# Patient Record
Sex: Male | Born: 1995 | Hispanic: Yes | Marital: Single | Smoking: Never smoker
Health system: Southern US, Community
[De-identification: ages and names within clinical notes are randomized; demographics above are authoritative.]

---

## 2018-12-22 DIAGNOSIS — L0390 Cellulitis, unspecified: Secondary | ICD-10-CM | POA: Diagnosis not present

## 2019-04-10 ENCOUNTER — Encounter (HOSPITAL_COMMUNITY): Payer: Self-pay | Admitting: Emergency Medicine

## 2019-04-10 ENCOUNTER — Emergency Department (HOSPITAL_COMMUNITY): Payer: BC Managed Care – PPO

## 2019-04-10 ENCOUNTER — Other Ambulatory Visit: Payer: Self-pay

## 2019-04-10 ENCOUNTER — Emergency Department (HOSPITAL_COMMUNITY)
Admission: EM | Admit: 2019-04-10 | Discharge: 2019-04-10 | Disposition: A | Discharge status: Home / Self Care | Payer: BC Managed Care – PPO | Attending: Emergency Medicine | Admitting: Emergency Medicine

## 2019-04-10 DIAGNOSIS — K2900 Acute gastritis without bleeding: Secondary | ICD-10-CM | POA: Insufficient documentation

## 2019-04-10 DIAGNOSIS — R072 Precordial pain: Secondary | ICD-10-CM | POA: Diagnosis not present

## 2019-04-10 DIAGNOSIS — R1013 Epigastric pain: Secondary | ICD-10-CM | POA: Diagnosis not present

## 2019-04-10 DIAGNOSIS — R079 Chest pain, unspecified: Secondary | ICD-10-CM

## 2019-04-10 LAB — CBC
HCT: 45.3 % (ref 39.0–52.0)
Hemoglobin: 15.8 g/dL (ref 13.0–17.0)
MCH: 31.2 pg (ref 26.0–34.0)
MCHC: 34.9 g/dL (ref 30.0–36.0)
MCV: 89.5 fL (ref 80.0–100.0)
Platelets: 312 10*3/uL (ref 150–400)
RBC: 5.06 MIL/uL (ref 4.22–5.81)
RDW: 12.2 % (ref 11.5–15.5)
WBC: 12.2 10*3/uL — ABNORMAL HIGH (ref 4.0–10.5)
nRBC: 0 % (ref 0.0–0.2)

## 2019-04-10 LAB — TROPONIN I (HIGH SENSITIVITY)
Troponin I (High Sensitivity): 3 ng/L (ref <18)
Troponin I (High Sensitivity): <2 ng/L (ref <18)

## 2019-04-10 LAB — BASIC METABOLIC PANEL
Anion gap: 12 (ref 5–15)
BUN: 17 mg/dL (ref 6–20)
CO2: 22 mmol/L (ref 22–32)
Calcium: 9.7 mg/dL (ref 8.9–10.3)
Chloride: 106 mmol/L (ref 98–111)
Creatinine, Ser: 1.12 mg/dL (ref 0.61–1.24)
GFR calc Af Amer: >60 mL/min (ref >60)
GFR calc non Af Amer: >60 mL/min (ref >60)
Glucose, Bld: 119 mg/dL — ABNORMAL HIGH (ref 70–99)
Potassium: 4 mmol/L (ref 3.5–5.1)
Sodium: 140 mmol/L (ref 135–145)

## 2019-04-10 MED ORDER — LANSOPRAZOLE 15 MG PO CPDR: 15.0000 mg | DELAYED_RELEASE_CAPSULE | Freq: Every day | ORAL | 0 refills | Status: AC

## 2019-04-10 MED ORDER — SODIUM CHLORIDE 0.9% FLUSH: 3.0000 mL | Freq: Once | INTRAVENOUS | Status: DC

## 2019-04-10 MED ORDER — ALUM & MAG HYDROXIDE-SIMETH 200-200-20 MG/5ML PO SUSP
30.0000 mL | Freq: Once | ORAL | Status: AC
  Administered 2019-04-10: 30 mL via ORAL
  Filled 2019-04-10: qty 30

## 2019-04-10 NOTE — ED Provider Notes (Signed)
[facility] Provider Note   CSN: [id number] Arrival date & time: 04/10/19  0037     History   Chief Complaint Chief Complaint  Patient presents with  . Chest Pain    HPI Paul Gould is a 23 y.o. male.     Patient with chest pain, started a few hours ago, thought that he was having a panic attack.  Patient states that he was shaking during his chest pain, and having some epigastric pain along with nausea and vomiting.  Patient states that it happened for about one hour.  Patient states he has been slightly nauseous with intermittent pain over a week or so.  No recent illness.  No numbness.  No tingling.  No sore throat.  He does state sometimes the pain feels like a burning sensation going up his chest.  The history is provided by the patient. No language interpreter was used.  Chest Pain Pain location:  Substernal area and epigastric Pain quality: aching, burning and sharp   Pain radiates to:  Does not radiate Pain severity:  Mild Onset quality:  Sudden Timing:  Constant Progression:  Improving Chronicity:  Recurrent Context: at rest   Relieved by:  None tried Ineffective treatments:  None tried Associated symptoms: abdominal pain, anxiety, heartburn, nausea and vomiting   Associated symptoms: no anorexia, no back pain, no dizziness, no fever, no lower extremity edema, no near-syncope, no orthopnea, no palpitations and no syncope   Risk factors: male sex   Risk factors: no aortic disease, no coronary artery disease, no Ehlers-Danlos syndrome, no immobilization and no prior DVT/PE     History reviewed. No pertinent past medical history.  There are no active problems to display for this patient.   History reviewed. No pertinent surgical history.      Home Medications    Prior to Admission medications   Medication Sig Start Date End Date Taking? Authorizing Provider  lansoprazole (PREVACID) 15 MG capsule Take 1 capsule (15  mg total) by mouth daily at 12 noon. 04/10/19   Louanne Skye, MD    Family History No family history on file.  Social History Social History   Tobacco Use  . Smoking status: Not on file  Substance Use Topics  . Alcohol use: Not on file  . Drug use: Not on file     Allergies   Patient has no known allergies.   Review of Systems Review of Systems  Constitutional: Negative for fever.  Cardiovascular: Positive for chest pain. Negative for palpitations, orthopnea, syncope and near-syncope.  Gastrointestinal: Positive for abdominal pain, heartburn, nausea and vomiting. Negative for anorexia.  Musculoskeletal: Negative for back pain.  Neurological: Negative for dizziness.  All other systems reviewed and are negative.    Physical Exam Updated Vital Signs BP 133/82 (BP Location: Left Arm)   Pulse 76   Temp 98.2 F (36.8 C) (Oral)   Resp 18   SpO2 97%   Physical Exam Vitals signs and nursing note reviewed.  Constitutional:      Appearance: He is well-developed.  HENT:     Head: Normocephalic.     Right Ear: External ear normal.     Left Ear: External ear normal.  Eyes:     Conjunctiva/sclera: Conjunctivae normal.  Neck:     Musculoskeletal: Normal range of motion and neck supple.  Cardiovascular:     Rate and Rhythm: Normal rate.     Heart sounds: Normal heart sounds. Heart sounds not distant.  No murmur.     Comments: Mild tenderness to palpation when I palpate the sternum.  And left sternal border. Pulmonary:     Effort: Pulmonary effort is normal.     Breath sounds: Normal breath sounds. No wheezing or rhonchi.  Abdominal:     General: Bowel sounds are normal.     Palpations: Abdomen is soft.  Musculoskeletal: Normal range of motion.  Skin:    General: Skin is warm and dry.  Neurological:     Mental Status: He is alert and oriented to person, place, and time.      ED Treatments / Results  Labs (all labs ordered are listed, but only abnormal results  are displayed) Labs Reviewed  BASIC METABOLIC PANEL - Abnormal; Notable for the following components:      Result Value   Glucose, Bld 119 (*)    All other components within normal limits  CBC - Abnormal; Notable for the following components:   WBC 12.2 (*)    All other components within normal limits  TROPONIN I (HIGH SENSITIVITY)  TROPONIN I (HIGH SENSITIVITY)    EKG EKG Interpretation  Date/Time:  Tuesday April 10 2019 00:50:29 EST Ventricular Rate:  127 PR Interval:  170 QRS Duration: 92 QT Interval:  282 QTC Calculation: 409 R Axis:   87 Text Interpretation: Sinus tachycardia Anterior infarct , age undetermined Abnormal ECG No old tracing to compare Confirmed by Dione BoozeGlick, David ([id number]) on 04/10/2019 12:59:15 AM   Radiology Dg Chest 2 View  Result Date: 04/10/2019 CLINICAL DATA:  Chest pain EXAM: CHEST - 2 VIEW COMPARISON:  None. FINDINGS: Heart and mediastinal contours are within normal limits. No focal opacities or effusions. No acute bony abnormality. IMPRESSION: Negative. Electronically Signed   By: Charlett NoseKevin  Dover M.D.   On: 04/10/2019 01:29    Procedures Procedures (including critical care time)  Medications Ordered in ED Medications  sodium chloride flush (NS) 0.9 % injection 3 mL (has no administration in time range)  alum & mag hydroxide-simeth (MAALOX/MYLANTA) 200-200-20 MG/5ML suspension 30 mL (30 mLs Oral Given 04/10/19 0416)     Initial Impression / Assessment and Plan / ED Course  I have reviewed the triage vital signs and the nursing notes.  Pertinent labs & imaging results that were available during my care of the patient were reviewed by me and considered in my medical decision making (see chart for details).        23 year old male who presents for acute onset of substernal chest pain along with some epigastric pain and nausea.  The pain is improving.  And lasted approximately 1 hour.  He has had this symptoms before.  Patient with likely reflux.   Will obtain EKG to evaluate for any arrhythmia or signs of ischemia.  Will obtain troponin.  Will obtain CBC to evaluate for any anemia.  Will check BMP.  Will obtain chest x-ray to evaluate for any signs of pneumothorax, pneumonia or enlarged heart.  Will give a GI cocktail to help for any gastritis.  EKG visualized by me, no acute abnormality noted.  No STEMI.  No delta, normal QTC.  Chest x-ray visualized by me and shows no pneumothorax or acute abnormality.  Troponins are 3 and last.  Patient's pain is improved after GI cocktail.  Will discharge home and have patient start on Prevacid.  Discussed signs that warrant reevaluation.  Will have follow-up with the PCP or return to ED if not improved within a week or so.  Discussed  signs that warrant sooner reevaluation.  Final Clinical Impressions(s) / ED Diagnoses   Final diagnoses:  Chest pain, unspecified type  Acute superficial gastritis without hemorrhage    ED Discharge Orders         Ordered    lansoprazole (PREVACID) 15 MG capsule  Daily     04/10/19 0512           Niel Hummer, MD 04/10/19 [phone]

## 2019-04-10 NOTE — ED Triage Notes (Signed)
Patient with chest pain, started a few hours ago, thought that he was having a panic attack.  Patient states that he was shaking during his chest pain, nausea and vomiting.  Patient states that it happened for about one hour.

## 2019-04-10 NOTE — ED Notes (Signed)
Pt independently ambulatory to [facility] department to be seen. Steady gait and no noted difficulty.

## 2019-04-13 DIAGNOSIS — Z20828 Contact with and (suspected) exposure to other viral communicable diseases: Secondary | ICD-10-CM | POA: Diagnosis not present

## 2019-04-13 DIAGNOSIS — J029 Acute pharyngitis, unspecified: Secondary | ICD-10-CM | POA: Diagnosis not present

## 2019-05-21 DIAGNOSIS — R0981 Nasal congestion: Secondary | ICD-10-CM | POA: Diagnosis not present

## 2019-05-21 DIAGNOSIS — R438 Other disturbances of smell and taste: Secondary | ICD-10-CM | POA: Diagnosis not present

## 2019-05-21 DIAGNOSIS — R430 Anosmia: Secondary | ICD-10-CM | POA: Diagnosis not present

## 2019-05-21 DIAGNOSIS — Z20828 Contact with and (suspected) exposure to other viral communicable diseases: Secondary | ICD-10-CM | POA: Diagnosis not present

## 2019-11-15 DIAGNOSIS — Z20822 Contact with and (suspected) exposure to covid-19: Secondary | ICD-10-CM | POA: Diagnosis not present

## 2020-09-26 DIAGNOSIS — R1013 Epigastric pain: Secondary | ICD-10-CM | POA: Diagnosis not present

## 2020-09-26 DIAGNOSIS — Z6830 Body mass index (BMI) 30.0-30.9, adult: Secondary | ICD-10-CM | POA: Diagnosis not present

## 2021-07-13 IMAGING — DX DG CHEST 2V
2 series · 2 of 2 positions shown · non-contrast
Comparison: None.

CLINICAL DATA: Chest pain

EXAM:
CHEST - 2 VIEW

[chest pa]
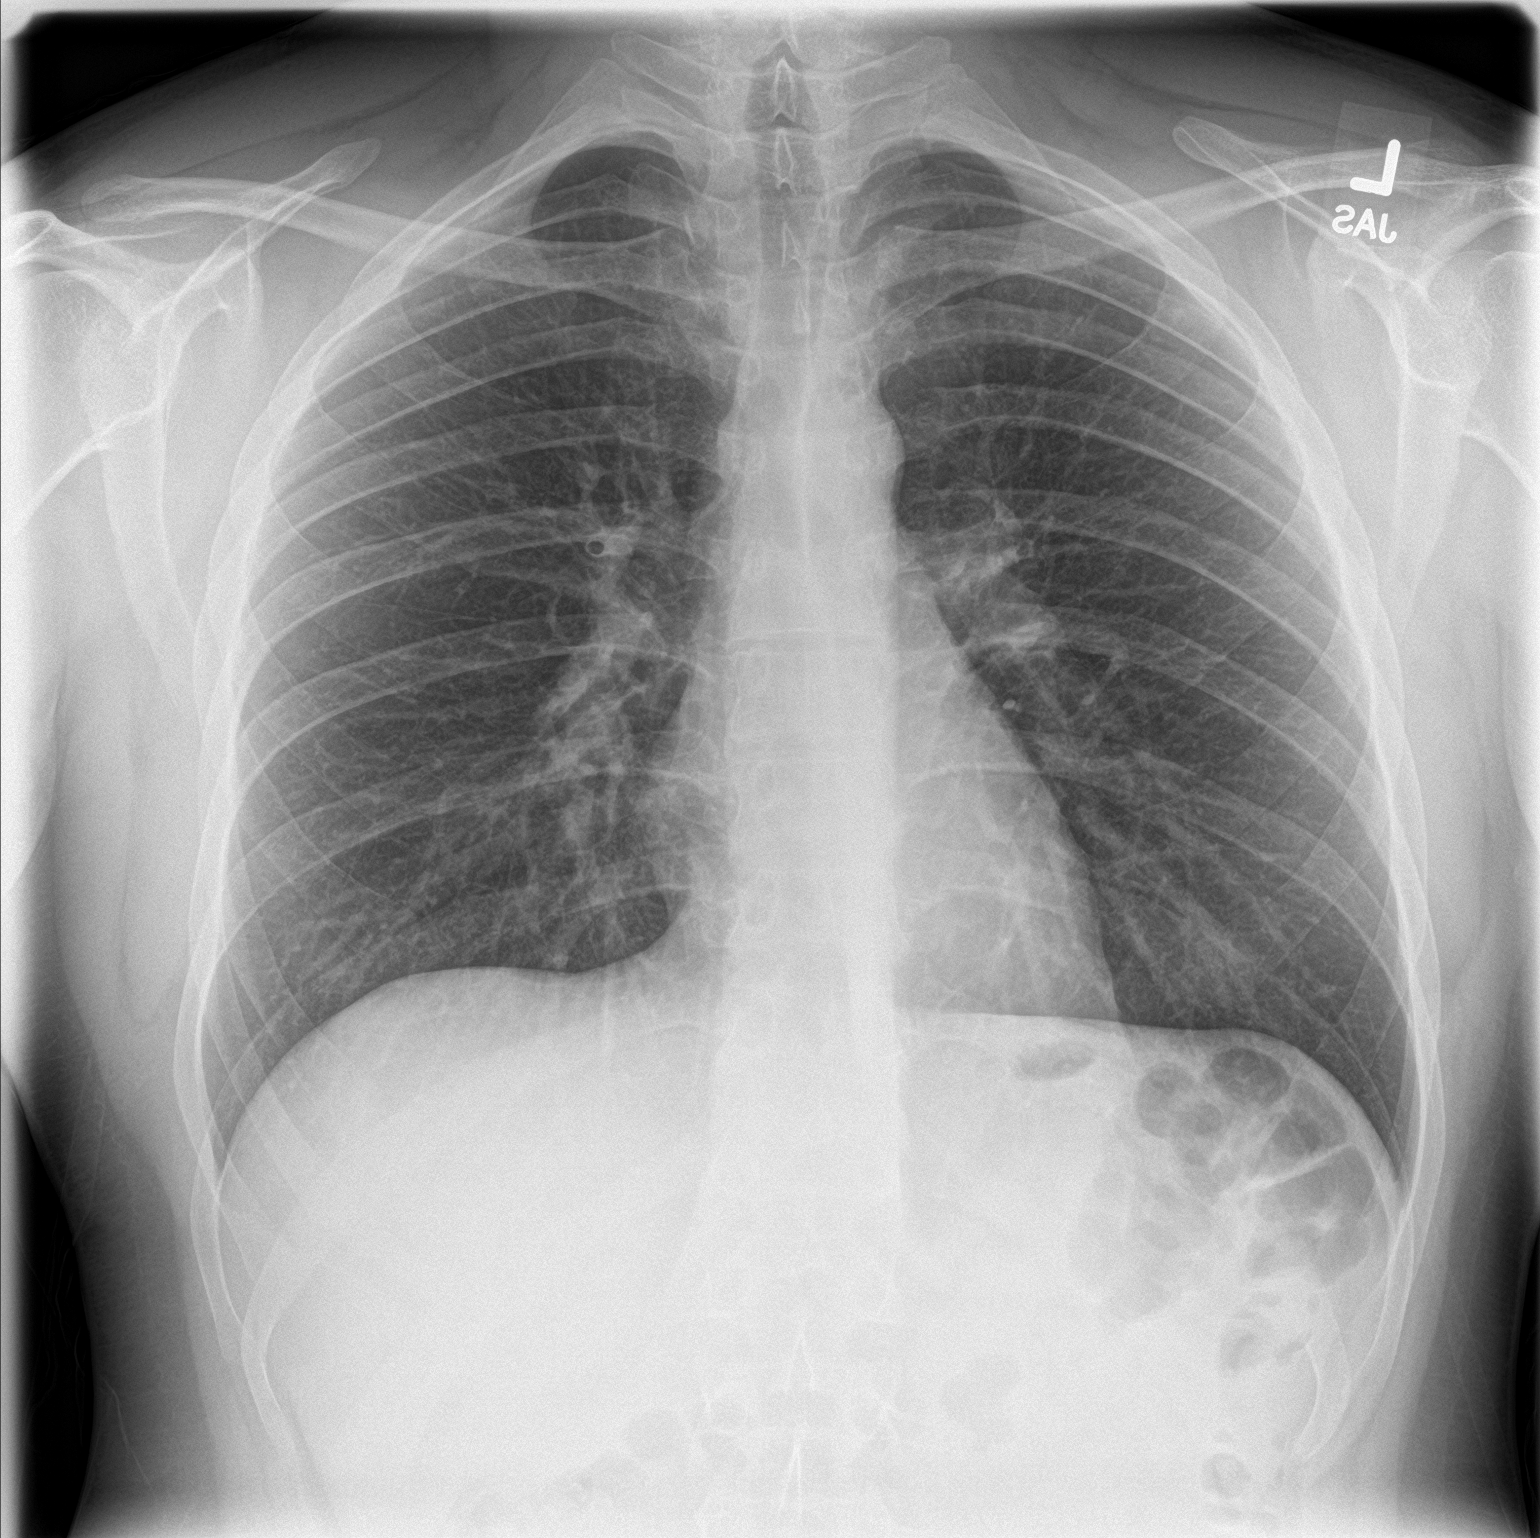

[chest lat]
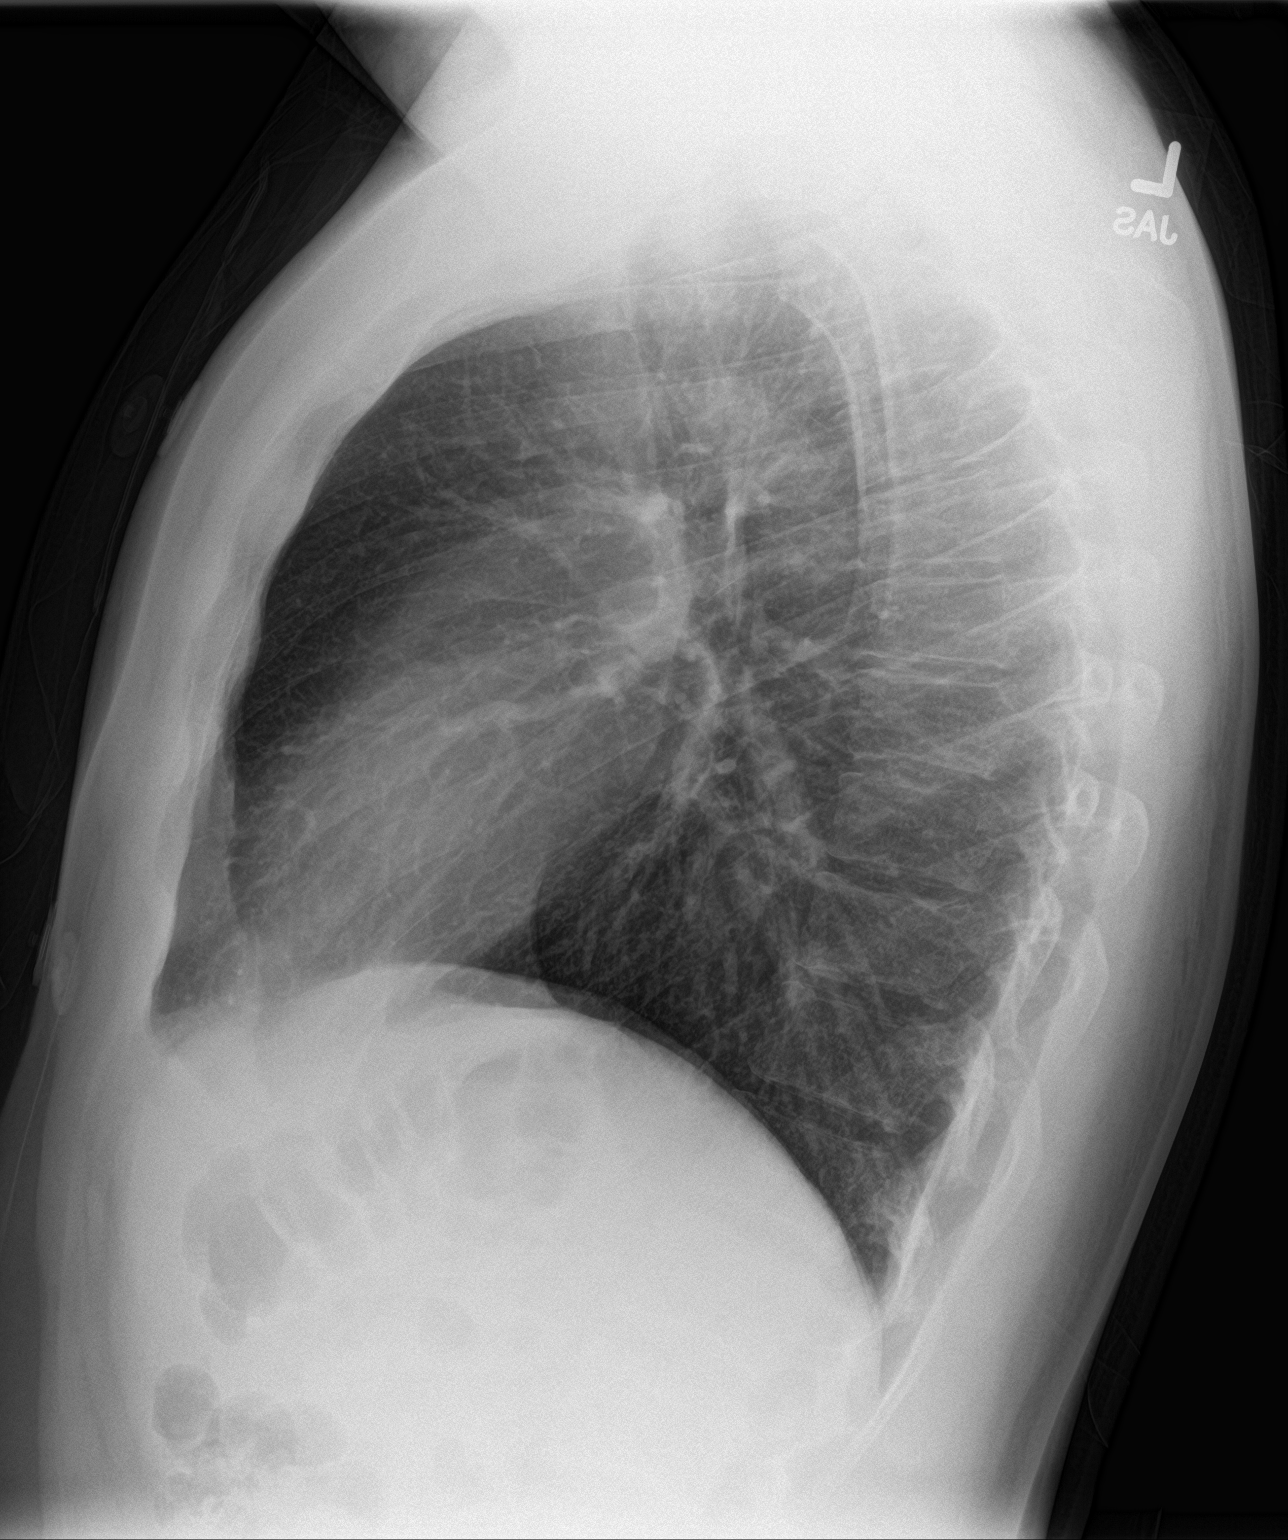

[2 of 2 positions shown; findings below may reference images not displayed]

FINDINGS: Heart and mediastinal contours are within normal limits. No focal
opacities or effusions. No acute bony abnormality.
IMPRESSION: Negative.

## 2022-08-26 ENCOUNTER — Encounter: Payer: Self-pay | Admitting: Internal Medicine

## 2022-08-26 ENCOUNTER — Ambulatory Visit: Payer: BC Managed Care – PPO | Admitting: Internal Medicine

## 2022-08-26 VITALS — BP 130/88 | HR 83 | Temp 97.8°F | Resp 16 | Ht 68.0 in | Wt 225.2 lb

## 2022-08-26 DIAGNOSIS — M546 Pain in thoracic spine: Secondary | ICD-10-CM

## 2022-08-26 MED ORDER — DICLOFENAC SODIUM 75 MG PO TBEC: 75.0000 mg | DELAYED_RELEASE_TABLET | Freq: Two times a day (BID) | ORAL | 0 refills | Status: AC

## 2023-03-01 ENCOUNTER — Encounter: Payer: Self-pay | Admitting: Internal Medicine
